# Patient Record
Sex: Male | Born: 1995 | Race: White | Hispanic: No | Marital: Single | Smoking: Never smoker
Health system: Southern US, Community
[De-identification: ages and names within clinical notes are randomized; demographics above are authoritative.]

---
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
## 2018-03-31 ENCOUNTER — Encounter (HOSPITAL_BASED_OUTPATIENT_CLINIC_OR_DEPARTMENT_OTHER): Payer: Self-pay | Admitting: *Deleted

## 2018-03-31 ENCOUNTER — Emergency Department (HOSPITAL_BASED_OUTPATIENT_CLINIC_OR_DEPARTMENT_OTHER)
Admission: EM | Admit: 2018-03-31 | Discharge: 2018-03-31 | Disposition: A | Discharge status: Home / Self Care | Attending: Emergency Medicine | Admitting: Emergency Medicine

## 2018-03-31 ENCOUNTER — Emergency Department (HOSPITAL_BASED_OUTPATIENT_CLINIC_OR_DEPARTMENT_OTHER)

## 2018-03-31 ENCOUNTER — Other Ambulatory Visit: Payer: Self-pay

## 2018-03-31 DIAGNOSIS — Z79899 Other long term (current) drug therapy: Secondary | ICD-10-CM | POA: Insufficient documentation

## 2018-03-31 DIAGNOSIS — N451 Epididymitis: Secondary | ICD-10-CM | POA: Diagnosis not present

## 2018-03-31 DIAGNOSIS — N50819 Testicular pain, unspecified: Secondary | ICD-10-CM | POA: Diagnosis present

## 2018-03-31 LAB — URINALYSIS, ROUTINE W REFLEX MICROSCOPIC
Bilirubin Urine: NEGATIVE
Glucose, UA: NEGATIVE mg/dL
Hgb urine dipstick: NEGATIVE
KETONES UR: NEGATIVE mg/dL
Leukocytes, UA: NEGATIVE
Nitrite: NEGATIVE
PROTEIN: NEGATIVE mg/dL
Specific Gravity, Urine: 1.02 (ref 1.005–1.030)
pH: 6 (ref 5.0–8.0)

## 2018-03-31 MED ORDER — NAPROXEN 375 MG PO TABS: 375.0000 mg | ORAL_TABLET | Freq: Two times a day (BID) | ORAL | 0 refills | Status: AC

## 2018-03-31 MED ORDER — DOXYCYCLINE HYCLATE 100 MG PO CAPS: 100.0000 mg | ORAL_CAPSULE | Freq: Two times a day (BID) | ORAL | 0 refills | Status: AC

## 2018-03-31 MED ORDER — KETOROLAC TROMETHAMINE 60 MG/2ML IM SOLN
60.0000 mg | Freq: Once | INTRAMUSCULAR | Status: AC
  Administered 2018-03-31: 60 mg via INTRAMUSCULAR
  Filled 2018-03-31: qty 2

## 2018-03-31 MED ORDER — CEFTRIAXONE SODIUM 250 MG IJ SOLR
250.0000 mg | Freq: Once | INTRAMUSCULAR | Status: AC
  Administered 2018-03-31: 250 mg via INTRAMUSCULAR
  Filled 2018-03-31: qty 250

## 2018-03-31 MED ORDER — DOXYCYCLINE HYCLATE 100 MG PO CAPS: 100.0000 mg | ORAL_CAPSULE | Freq: Two times a day (BID) | ORAL | 0 refills | Status: DC

## 2018-03-31 NOTE — ED Provider Notes (Signed)
[facility] EMERGENCY DEPARTMENT Provider Note   CSN: [id number] Arrival date & time: 03/31/18  1752     History   Chief Complaint Chief Complaint  Patient presents with  . Testicle Pain    HPI Grant Bennett is a 21 y.o. male.  HPI pleasant 22 year old male here with testicular pain.  The patient states that off and on for the last year, he has had intermittent testicular pain.  He currently works in the special forces and states that he had not mentioned anything because he did not want to interrupt his training.  He states that over the last year,, particularly after training, he has had a dull, aching, pain along his bilateral testes.  The symptoms seem to get better when he rests and elevates.  Denies any dysuria. No penile discharge or lesions. He is not sexually active. No fevers or chills. He has not noticed any twisted or asymmetric swelling. No open wounds.  History reviewed. No pertinent past medical history.  There are no active problems to display for this patient.   Past Surgical History:  Procedure Laterality Date  . CLOSED REDUCTION FINGER WITH PERCUTANEOUS PINNING Right 02/18/2014   Procedure: Right small finger irrigation and debridement and open reduction and pinning of distal phalanx fracture, repair of nail bed;  Surgeon: Betha Loa, MD;  Location: [facility] OR;  Service: Orthopedics;  Laterality: Right;        Home Medications    Prior to Admission medications   Medication Sig Start Date End Date Taking? Authorizing Provider  cephALEXin (KEFLEX) 500 MG capsule Take 1 capsule (500 mg total) by mouth 4 (four) times daily. 07/21/16   Benjiman Core, MD  doxycycline (VIBRAMYCIN) 100 MG capsule Take 1 capsule (100 mg total) by mouth 2 (two) times daily for 10 days. 03/31/18 04/10/18  Shaune Pollack, MD  HYDROcodone-acetaminophen [facility]) 5-325 MG per tablet 1 tab po q6 hours prn pain 02/19/14   Betha Loa, MD  ibuprofen (ADVIL,MOTRIN) 200 MG tablet  Take 200 mg by mouth every 6 (six) hours as needed for moderate pain.    [provider]  naproxen (NAPROSYN) 375 MG tablet Take 1 tablet (375 mg total) by mouth 2 (two) times daily with a meal for 10 days. 03/31/18 04/10/18  Shaune Pollack, MD    Family History No family history on file.  Social History Social History   Tobacco Use  . Smoking status: Never Smoker  . Smokeless tobacco: Never Used  Substance Use Topics  . Alcohol use: No  . Drug use: No     Allergies   Patient has no known allergies.   Review of Systems Review of Systems  Constitutional: Negative for chills, fatigue and fever.  HENT: Negative for congestion and rhinorrhea.   Eyes: Negative for visual disturbance.  Respiratory: Negative for cough, shortness of breath and wheezing.   Cardiovascular: Negative for chest pain and leg swelling.  Gastrointestinal: Negative for abdominal pain, diarrhea, nausea and vomiting.  Genitourinary: Positive for testicular pain. Negative for dysuria and flank pain.  Musculoskeletal: Negative for neck pain and neck stiffness.  Skin: Negative for rash and wound.  Allergic/Immunologic: Negative for immunocompromised state.  Neurological: Negative for syncope, weakness and headaches.  All other systems reviewed and are negative.    Physical Exam Updated Vital Signs BP (!) 140/59 (BP Location: Right Arm)   Pulse 67   Temp 98.3 F (36.8 C) (Oral)   Resp 18   Ht 5\' 5"  (1.651 m)  Wt 65.8 kg   SpO2 98%   BMI 24.13 kg/m   Physical Exam  Constitutional: He is oriented to person, place, and time. He appears well-developed and well-nourished. No distress.  HENT:  Head: Normocephalic and atraumatic.  Eyes: Conjunctivae are normal.  Neck: Neck supple.  Cardiovascular: Normal rate, regular rhythm and normal heart sounds.  Pulmonary/Chest: Effort normal. No respiratory distress. He has no wheezes.  Abdominal: He exhibits no distension. Hernia confirmed negative in  the right inguinal area and confirmed negative in the left inguinal area.  Genitourinary: Penis normal. Cremasteric reflex is present. Right testis shows tenderness (mild, posterolateral). Right testis shows no mass and no swelling. Right testis is descended. Cremasteric reflex is not absent on the right side. Left testis shows tenderness (mild, posterolateral). Left testis shows no mass and no swelling. Left testis is descended. Cremasteric reflex is not absent on the left side. Circumcised.  Musculoskeletal: He exhibits no edema.  Neurological: He is alert and oriented to person, place, and time. He exhibits normal muscle tone.  Skin: Skin is warm. Capillary refill takes less than 2 seconds. No rash noted.  Nursing note and vitals reviewed.    ED Treatments / Results  Labs (all labs ordered are listed, but only abnormal results are displayed) Labs Reviewed  URINALYSIS, ROUTINE W REFLEX MICROSCOPIC    EKG None  Radiology US Scrotum  Result Date: 03/31/2018 CLINICAL DATA:  Bilateral testicular pain for 1 year, initial encounter EXAM: SCROTAL ULTRASOUND DOPPLER ULTRASOUND OF THE TESTICLES TECHNIQUE: Complete ultrasound examination of the testicles, epididymis, and other scrotal structures was performed. Color and spectral Doppler ultrasound were also utilized to evaluate blood flow to the testicles. COMPARISON:  None. FINDINGS: Right testicle Measurements: 4.3 x 3.1 x 2.4 cm. No mass or microlithiasis visualized. Left testicle Measurements: 4.7 x 3.4 x 2.6 cm. No mass or microlithiasis visualized. Right epididymis: Mild heterogeneity is noted within the right epididymis. Left epididymis:  Normal in size and appearance. Hydrocele: Hydroceles are noted bilaterally right greater than left. Varicocele:  None visualized. Pulsed Doppler interrogation of both testes demonstrates normal low resistance arterial and venous waveforms bilaterally. IMPRESSION: Normal-appearing testicles bilaterally. Mild  heterogeneity in the right epididymis of uncertain significance. Follow-up examination in 3 months is recommended for further evaluation. Electronically Signed   By: Alcide Clever M.D.   On: 03/31/2018 21:15    Procedures Procedures (including critical care time)  Medications Ordered in ED Medications  ketorolac (TORADOL) injection 60 mg (60 mg Intramuscular Given 03/31/18 1932)  cefTRIAXone (ROCEPHIN) injection 250 mg (250 mg Intramuscular Given 03/31/18 2131)     Initial Impression / Assessment and Plan / ED Course  I have reviewed the triage vital signs and the nursing notes.  Pertinent labs & imaging results that were available during my care of the patient were reviewed by me and considered in my medical decision making (see chart for details).     22 year old male here with bilateral testicular pain for several months to a year.  On exam, he has no evidence of torsion and his history is not consistent with this.  He has no signs of testicular mass.  No other lesions or skin sores.  UA negative.  I suspect this could be due to repetitive small trauma to epididymis from his [employer] training. US shows mild heterogeneity of R epidiymis - f/u in 3 months. Given this findings and the fact that he does have some epididymal TTP, will tx with ABX, NSAIDs, scrotal support.  He is aware of U/S findings and need for repeat U/S in 3 months.  Final Clinical Impressions(s) / ED Diagnoses   Final diagnoses:  Epididymitis    ED Discharge Orders         Ordered    doxycycline (VIBRAMYCIN) 100 MG capsule  2 times daily,   Status:  Discontinued     03/31/18 2124    naproxen (NAPROSYN) 375 MG tablet  2 times daily with meals     03/31/18 2124    doxycycline (VIBRAMYCIN) 100 MG capsule  2 times daily     03/31/18 2129           Shaune Pollack, MD 03/31/18 2304

## 2018-03-31 NOTE — ED Triage Notes (Signed)
Pain in both testicles. Denies penile discharge.

## 2018-03-31 NOTE — Discharge Instructions (Addendum)
Wear supportive underwear  Take the medications as prescribed  Follow-up in 3 months for a repeat ultrasound

## 2018-10-17 IMAGING — US US SCROTUM
1 series · 14 of 25 positions shown · non-contrast
Comparison: None.

CLINICAL DATA: Bilateral testicular pain for 1 year, initial
encounter

EXAM:
SCROTAL ULTRASOUND
DOPPLER ULTRASOUND OF THE TESTICLES
TECHNIQUE: Complete ultrasound examination of the testicles, epididymis, and
other scrotal structures was performed. Color and spectral Doppler
ultrasound were also utilized to evaluate blood flow to the
testicles.

[Series 1: us scrotum · 0.06mm/px · 47 acquisitions, 14 frames shown]
[im 1/47]
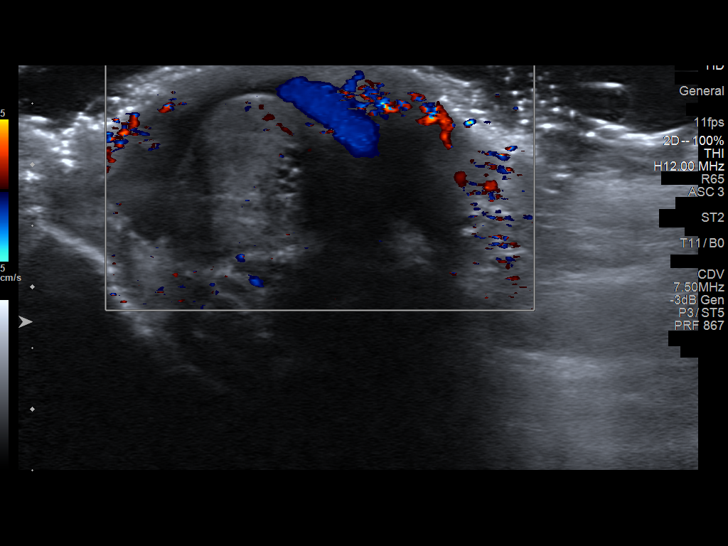
[im 4/47]
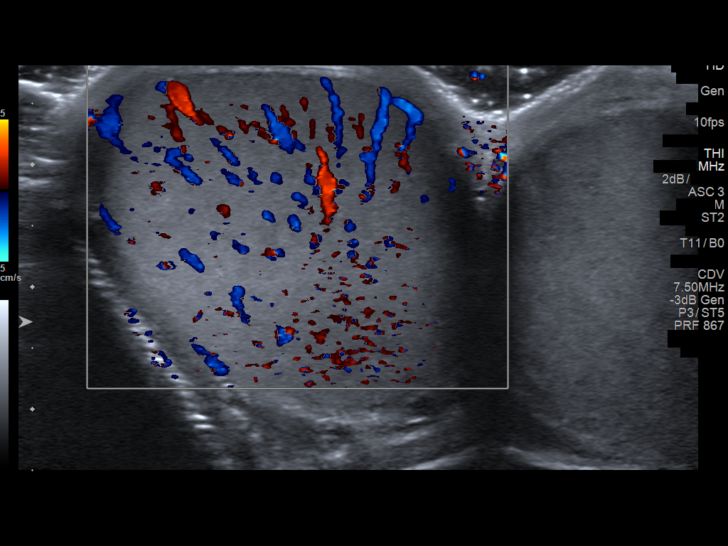
[im 8/47]
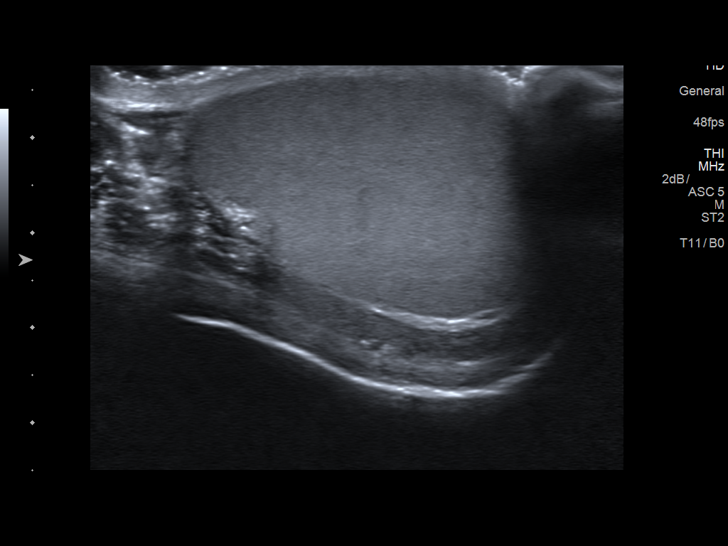
[im 12/47]
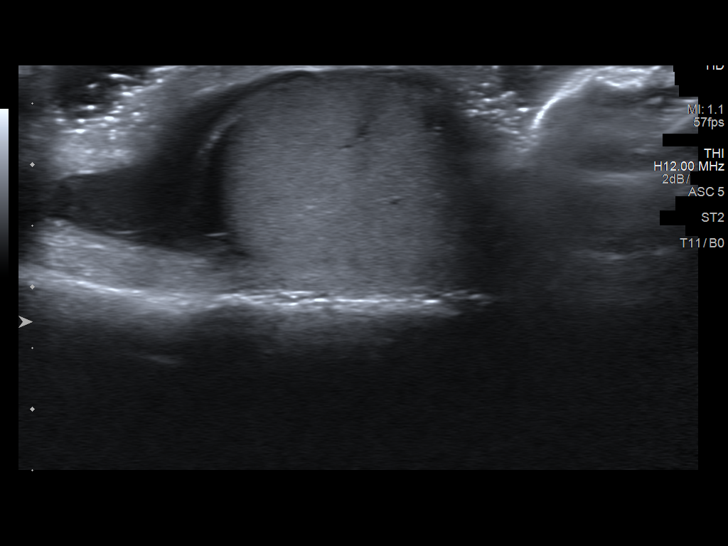
[im 16/47]
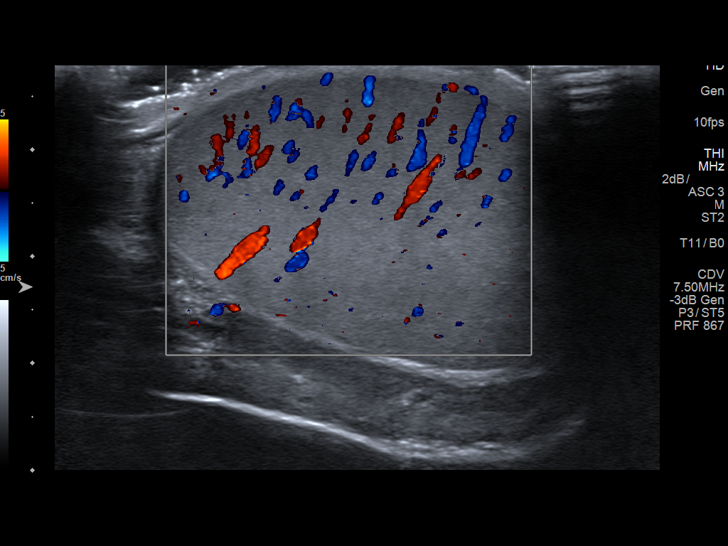
[im 18/47]
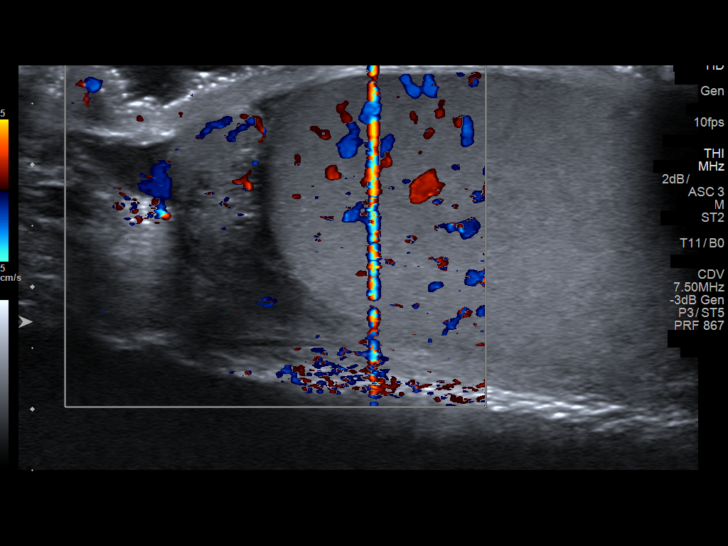
[im 22/47]
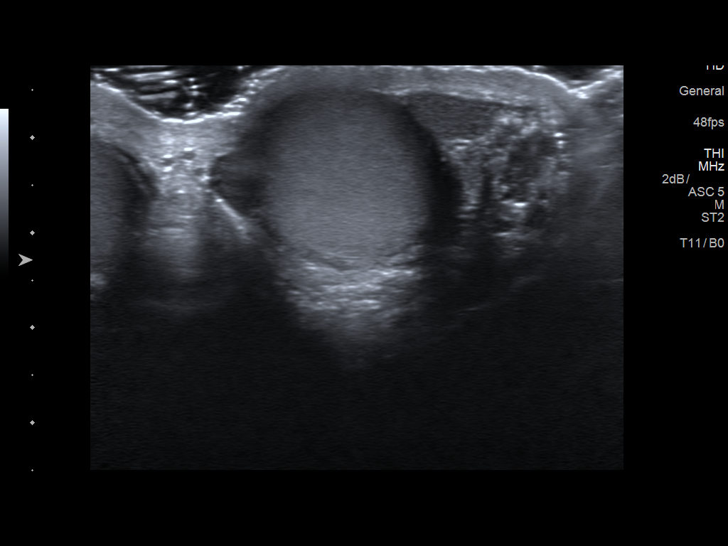
[im 25/47]
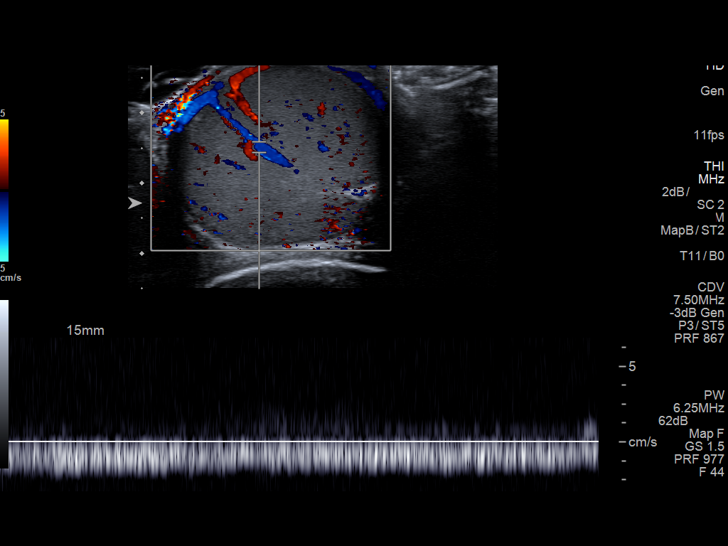
[im 29/47]
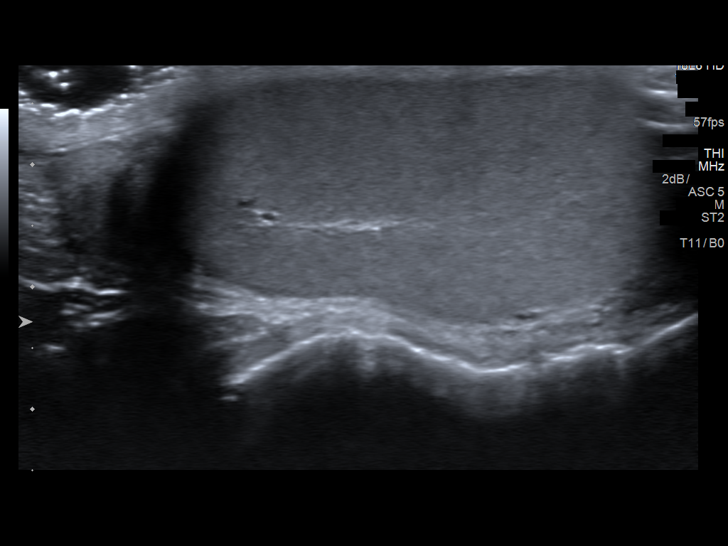
[im 31/47]
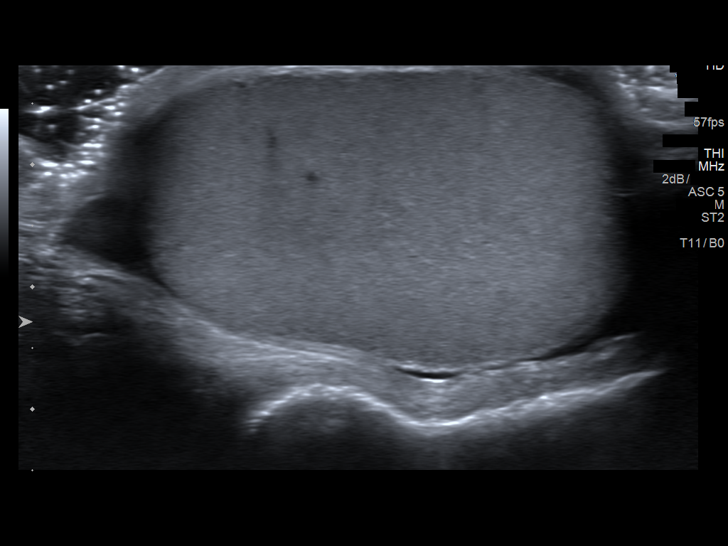
[im 35/47]
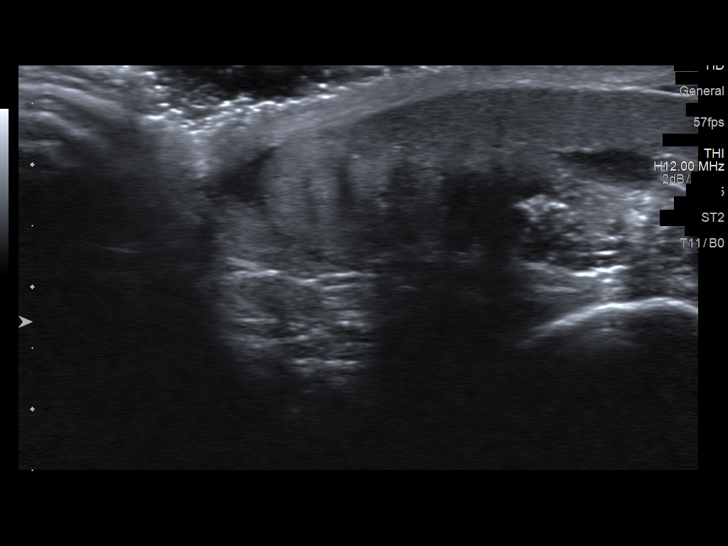
[im 39/47]
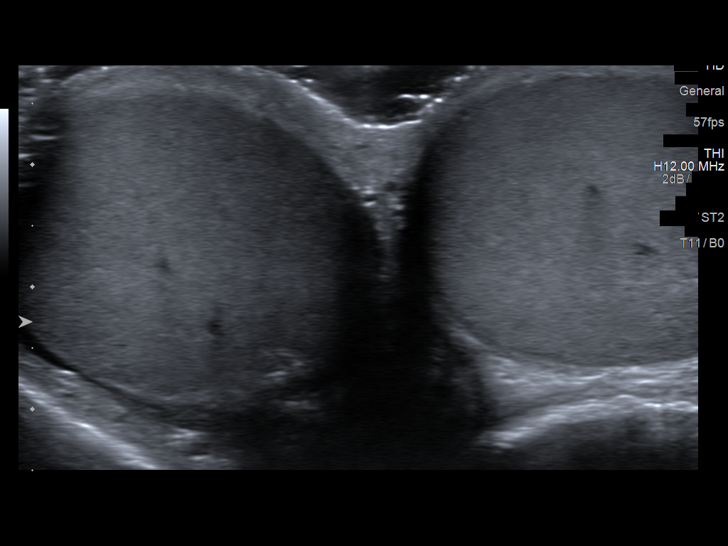
[im 43/47]
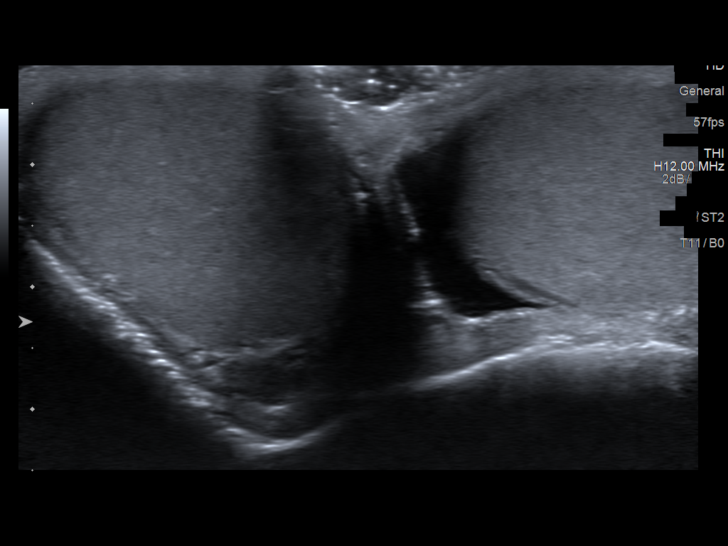
[im 47/47]
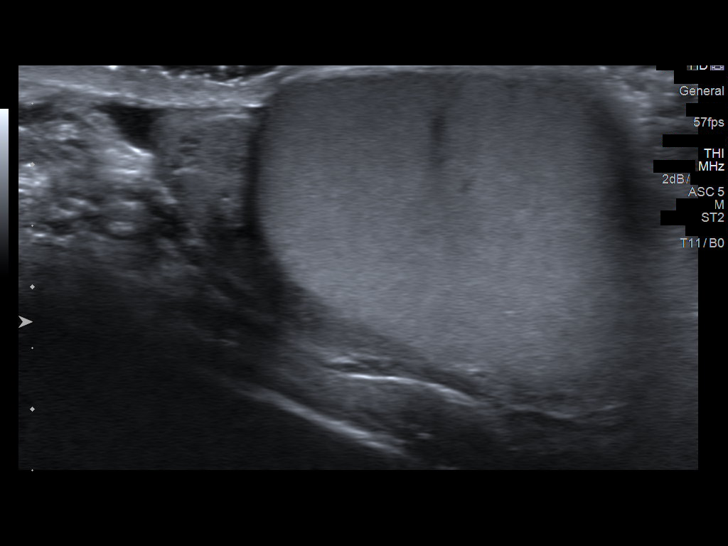

[14 of 25 positions shown; findings below may reference images not displayed]

FINDINGS: Right testicle

Measurements: 4.3 x 3.1 x 2.4 cm.. No mass or microlithiasis
visualized.

Left testicle

Measurements: 4.7 x 3.4 x 2.6 cm.. No mass or microlithiasis
visualized.

Right epididymis: Mild heterogeneity is noted within the right
epididymis.

Left epididymis:  Normal in size and appearance.

Hydrocele: Hydroceles are noted bilaterally right greater than left.

Varicocele:  None visualized.

Pulsed Doppler interrogation of both testes demonstrates normal low
resistance arterial and venous waveforms bilaterally.
IMPRESSION: Normal-appearing testicles bilaterally.

Mild heterogeneity in the right epididymis of uncertain
significance. Follow-up examination in 3 months is recommended for
further evaluation.
